# Patient Record
Sex: Male | Born: 1965 | Hispanic: No | Marital: Married | Smoking: Former smoker
Health system: Southern US, Community
[De-identification: ages and names within clinical notes are randomized; demographics above are authoritative.]

## PROBLEM LIST (undated history)

## (undated) DIAGNOSIS — J45909 Unspecified asthma, uncomplicated: Secondary | ICD-10-CM

## (undated) DIAGNOSIS — D649 Anemia, unspecified: Secondary | ICD-10-CM

## (undated) DIAGNOSIS — Z9109 Other allergy status, other than to drugs and biological substances: Secondary | ICD-10-CM

## (undated) DIAGNOSIS — T7840XA Allergy, unspecified, initial encounter: Secondary | ICD-10-CM

## (undated) HISTORY — DX: Anemia, unspecified: D64.9

## (undated) HISTORY — PX: POLYPECTOMY: SHX149

## (undated) HISTORY — DX: Allergy, unspecified, initial encounter: T78.40XA

---

## 2006-06-11 ENCOUNTER — Ambulatory Visit (HOSPITAL_COMMUNITY): Admission: RE | Admit: 2006-06-11 | Discharge: 2006-06-11 | Payer: Self-pay | Admitting: Surgery

## 2008-12-18 HISTORY — PX: POLYPECTOMY: SHX149

## 2009-12-18 HISTORY — PX: INGUINAL HERNIA REPAIR: SUR1180

## 2012-08-06 ENCOUNTER — Emergency Department
Admission: EM | Admit: 2012-08-06 | Discharge: 2012-08-06 | Disposition: A | Discharge status: Home / Self Care | Payer: PRIVATE HEALTH INSURANCE | Source: Home / Self Care

## 2012-08-06 ENCOUNTER — Emergency Department (INDEPENDENT_AMBULATORY_CARE_PROVIDER_SITE_OTHER): Payer: PRIVATE HEALTH INSURANCE

## 2012-08-06 ENCOUNTER — Encounter: Payer: Self-pay | Admitting: Emergency Medicine

## 2012-08-06 DIAGNOSIS — J449 Chronic obstructive pulmonary disease, unspecified: Secondary | ICD-10-CM

## 2012-08-06 DIAGNOSIS — R062 Wheezing: Secondary | ICD-10-CM

## 2012-08-06 DIAGNOSIS — J069 Acute upper respiratory infection, unspecified: Secondary | ICD-10-CM

## 2012-08-06 DIAGNOSIS — R05 Cough: Secondary | ICD-10-CM

## 2012-08-06 HISTORY — DX: Other allergy status, other than to drugs and biological substances: Z91.09

## 2012-08-06 HISTORY — DX: Unspecified asthma, uncomplicated: J45.909

## 2012-08-06 MED ORDER — DOXYCYCLINE HYCLATE 100 MG PO CAPS: 100.0000 mg | ORAL_CAPSULE | Freq: Two times a day (BID) | ORAL | Status: AC

## 2012-08-06 MED ORDER — ALBUTEROL SULFATE HFA 108 (90 BASE) MCG/ACT IN AERS: 2.0000 | INHALATION_SPRAY | RESPIRATORY_TRACT | Status: AC | PRN

## 2012-08-06 MED ORDER — METHYLPREDNISOLONE SODIUM SUCC 125 MG IJ SOLR
125.0000 mg | Freq: Once | INTRAMUSCULAR | Status: AC
  Administered 2012-08-06: 125 mg via INTRAMUSCULAR

## 2012-08-06 MED ORDER — PREDNISONE 50 MG PO TABS: ORAL_TABLET | ORAL | Status: AC

## 2012-08-06 NOTE — ED Notes (Signed)
Patient reports he has had URI/sinus problems for weeks; has had 2 courses of prednisone and symptoms return: nasal congestion, cough, sinus pain, SOB, wheezing (hx chronic asthma) and fatigue.

## 2012-08-06 NOTE — ED Provider Notes (Signed)
History     CSN: [id number]  Arrival date & time 08/06/12  1601   First MD Initiated Contact with Patient 08/06/12 1603      Chief Complaint  Patient presents with  . Nasal Congestion  . Cough  . Facial Pain   HPI URI Symptoms Onset: 2-3 days   Description: rhinorrhea, nasal congestion, wheezing, SOB.   Modifying factors:  Pt has been seen by allergist as well as PCP for similar sxs twice in the past 2 months. Has been on 2 courses of prednisone. Pt states that sxs resolve with prednisone use, but then recur within 1-2 weeks of stopping steroid. Pt is also currently on advair 230-21 for asthma. Has been compliant with this. Does not have an albuterol inhaler for use. Predominant issue has been congestion and dyspnea/wheezing on exertion. Has had some new sick contacts at work over last 1-2 weeks. Pt also reports hx/o 20-25 pack year smoking. Currently not smoking.   Symptoms Nasal discharge: yes Fever: no Sore throat: no Cough: yes Wheezing: yes Ear pain: no GI symptoms: no Sick contacts: yes  Red Flags  Stiff neck: no Dyspnea: yes; exertional with wheezing Rash: no Swallowing difficulty: no  Sinusitis Risk Factors Headache/face pain: n Double sickening: no tooth pain: no  Allergy Risk Factors Sneezing: no Itchy scratchy throat: no Seasonal symptoms: yes  Flu Risk Factors Headache: no muscle aches: no severe fatigue: no    Past Medical History  Diagnosis Date  . Asthma   . Environmental allergies     Past Surgical History  Procedure Date  . Polypectomy     Family History  Problem Relation Age of Onset  . Hypertension Mother     History  Substance Use Topics  . Smoking status: Never Smoker   . Smokeless tobacco: Not on file  . Alcohol Use: No      Review of Systems  All other systems reviewed and are negative.    Allergies  Erythromycin  Home Medications   Current Outpatient Rx  Name Route Sig Dispense Refill  . CETIRIZINE HCL  10 MG PO CHEW Oral Chew 10 mg by mouth daily.    Marland Kitchen FLUTICASONE-SALMETEROL 230-21 MCG/ACT IN AERO Inhalation Inhale 2 puffs into the lungs 2 (two) times daily.      BP 106/66  Pulse 72  Temp 97.5 F (36.4 C) (Oral)  Resp 16  Ht 5\' 10"  (1.778 m)  Wt 150 lb (68.04 kg)  BMI 21.52 kg/m2  SpO2 95%  Physical Exam  Constitutional: He appears well-developed and well-nourished. No distress.  HENT:  Head: Normocephalic and atraumatic.  Right Ear: External ear normal.  Left Ear: External ear normal.       +nasal erythema, rhinorrhea bilaterally, + post oropharyngeal erythema    Eyes: Conjunctivae are normal. Pupils are equal, round, and reactive to light.  Neck: Normal range of motion.  Cardiovascular: Normal rate and regular rhythm.   Pulmonary/Chest: No respiratory distress. He has wheezes. He has no rales. He exhibits no tenderness.  Abdominal: Soft.  Musculoskeletal: Normal range of motion.  Lymphadenopathy:    He has no cervical adenopathy.  Neurological: He is alert.  Skin: Skin is warm.    ED Course  Procedures (including critical care time)  Labs Reviewed - No data to display Dg Chest 2 View  08/06/2012  *RADIOLOGY REPORT*  Clinical Data: Chronic cough, wheezing.  CHEST - 2 VIEW  Comparison: None.  Findings: There is hyperinflation of the lungs compatible with  COPD.  Peribronchial thickening.  Mild biapical scarring.  No acute opacities or effusions.  No acute bony abnormality.  IMPRESSION: COPD/chronic changes.  No active disease.   Original Report Authenticated By: Cyndie Chime, M.D.      1. URI (upper respiratory infection)   2. Obstructive lung disease       MDM  Suspect underlying COPD with viral induced COPD exacerbation.  Solumedrol 125mg  IM x1. Duoneb x 1.  Repeat lung exam shows improved aeration s/p duoneb.  Prednisone and doxy for treatment.  Plan for follow up with PCP in 3-5 days.  Suspect that pt may benefit from uptitration of resp medication  regimen vs. Formal PFTs if these have not already been done.  Resp and infectious red flags reviewed.      The patient and/or caregiver has been counseled thoroughly with regard to treatment plan and/or medications prescribed including dosage, schedule, interactions, rationale for use, and possible side effects and they verbalize understanding. Diagnoses and expected course of recovery discussed and will return if not improved as expected or if the condition worsens. Patient and/or caregiver verbalized understanding.               Doree Albee, MD 08/06/12 1757

## 2013-07-19 IMAGING — CR DG CHEST 2V
2 series · 2 of 2 positions shown · non-contrast
Comparison: None.

CLINICAL DATA: Chronic cough, wheezing.

CHEST - 2 VIEW

[view not recorded (1 of 2)]
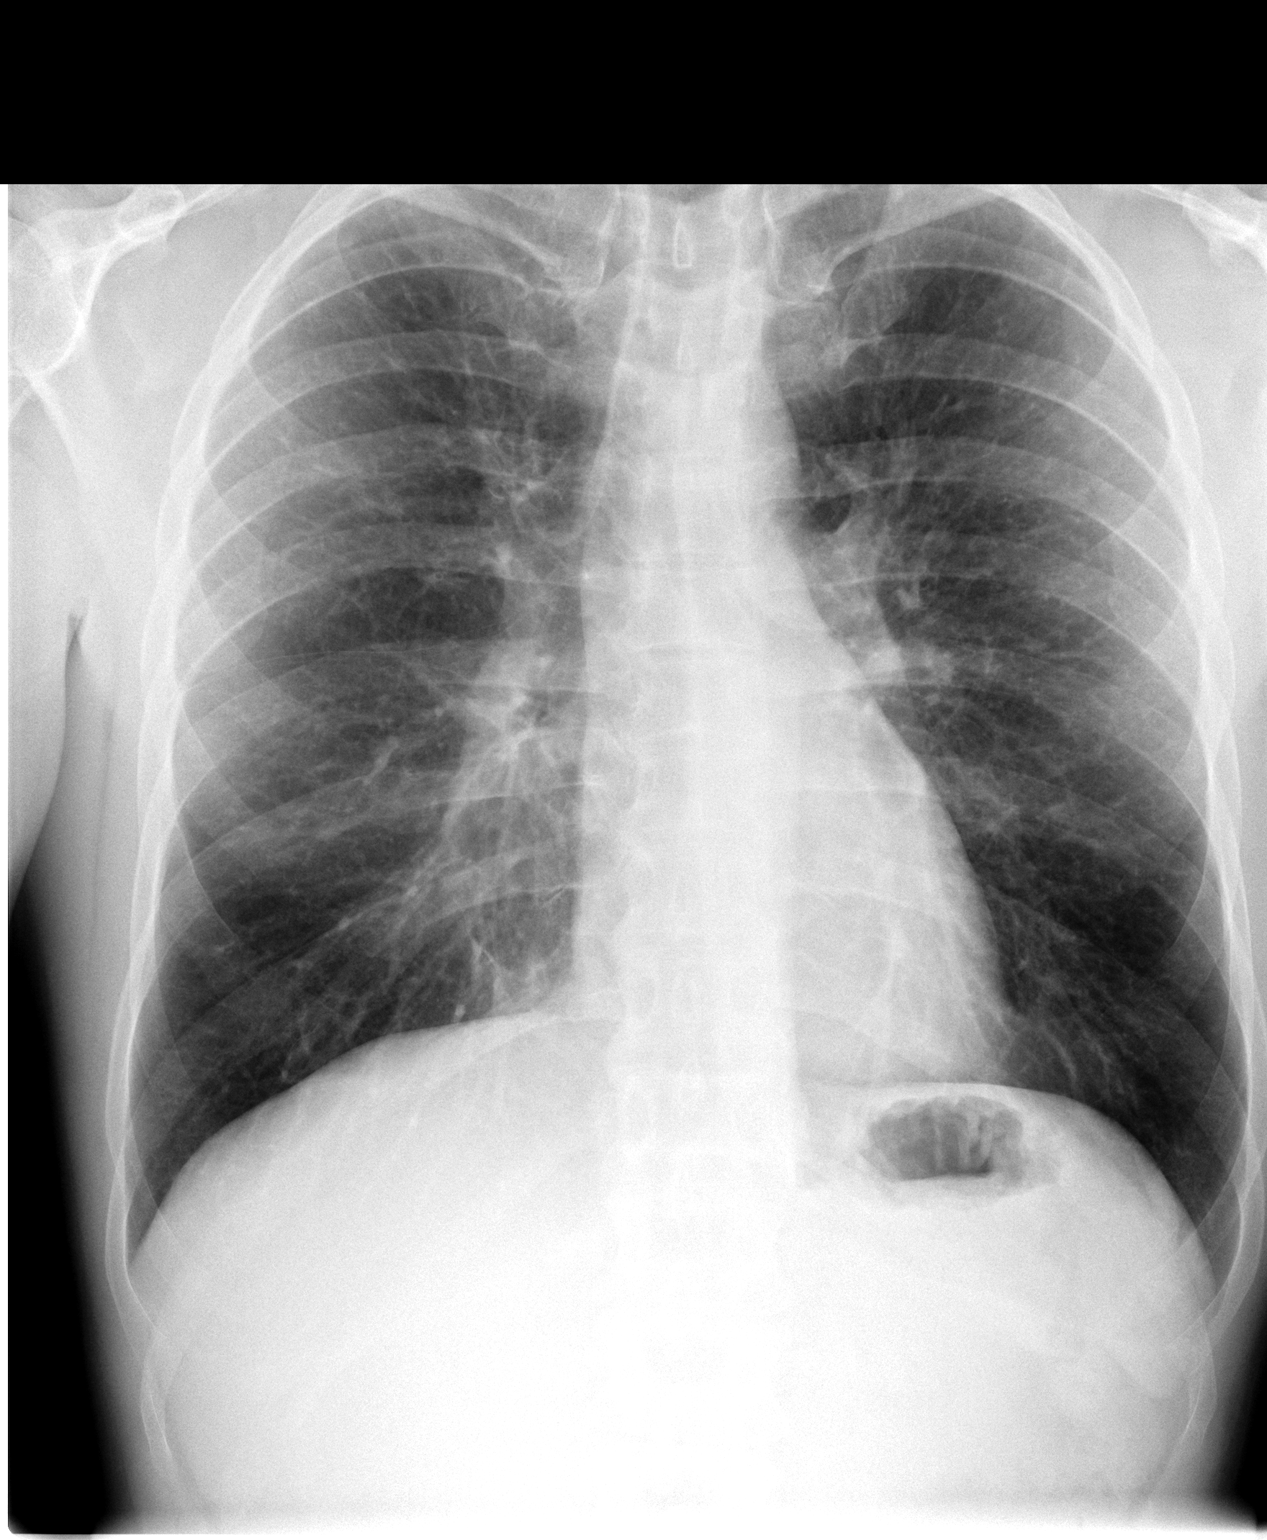

[view not recorded (2 of 2)]
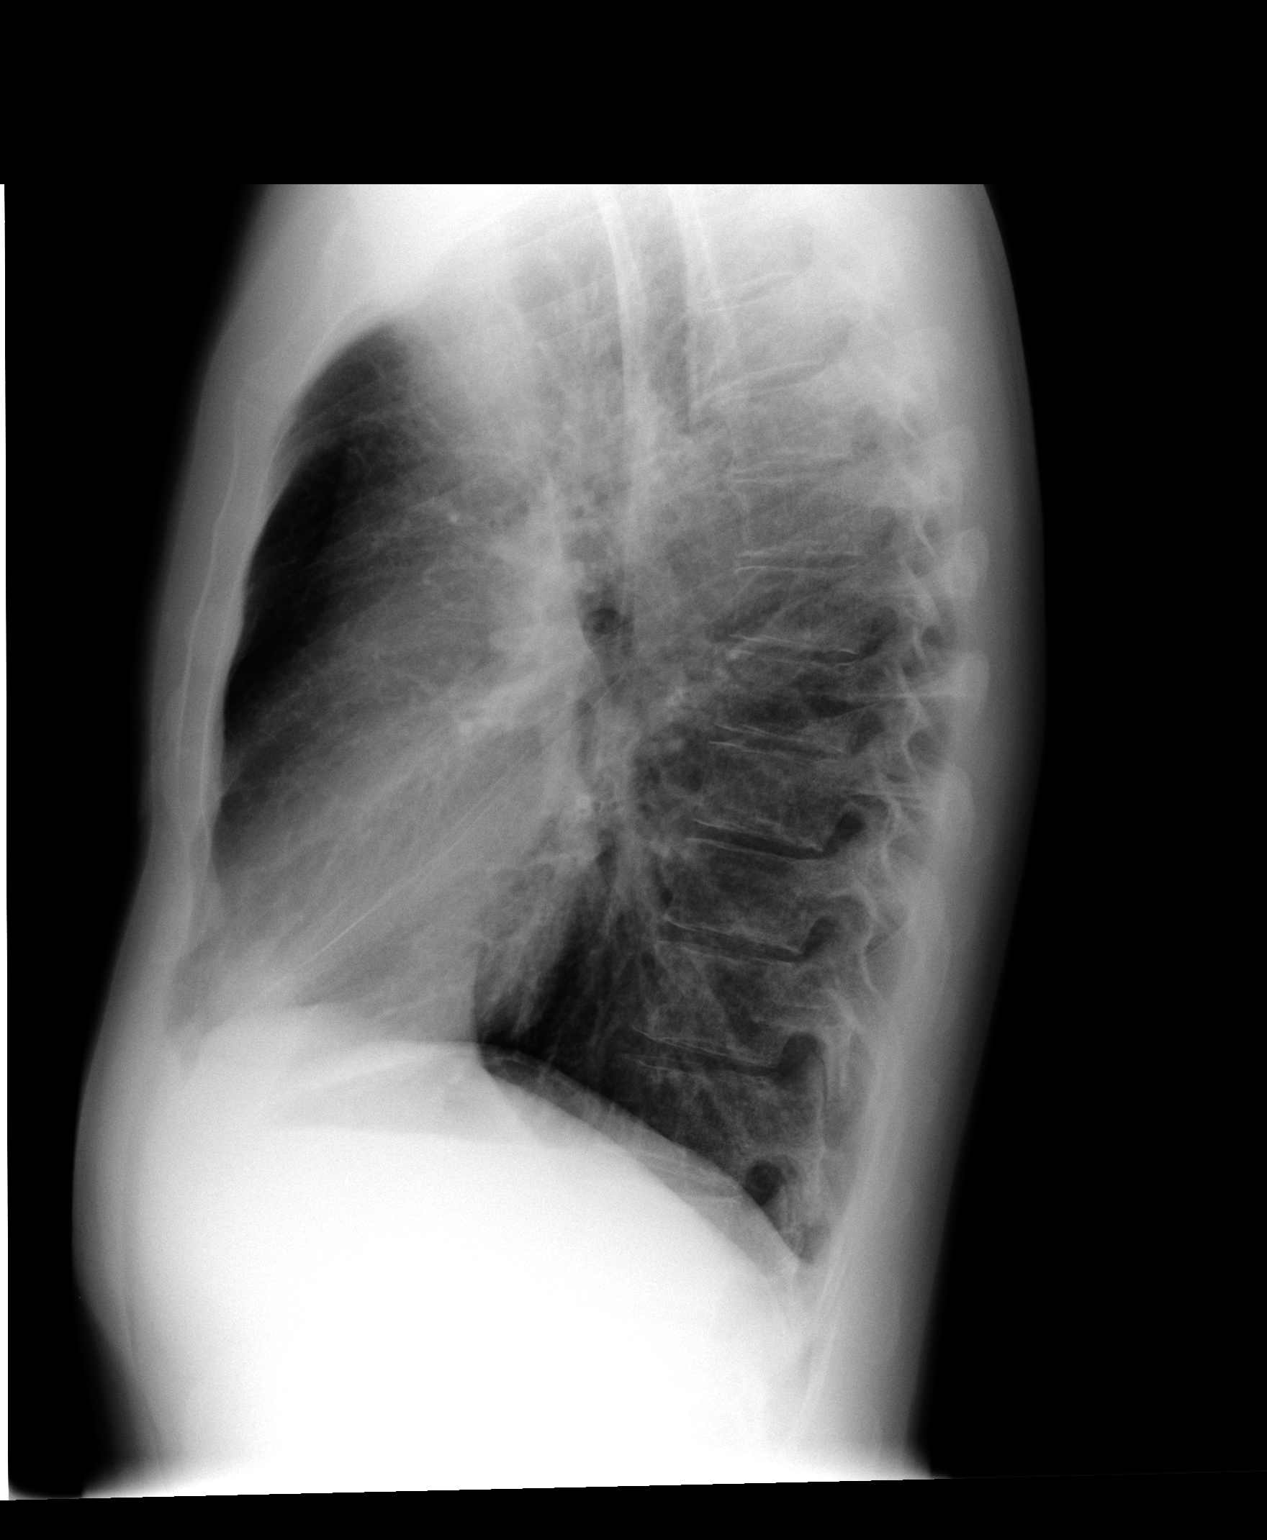

[2 of 2 positions shown; findings below may reference images not displayed]

FINDINGS: There is hyperinflation of the lungs compatible with
COPD.  Peribronchial thickening.  Mild biapical scarring.  No acute
opacities or effusions.  No acute bony abnormality.
IMPRESSION: COPD/chronic changes.  No active disease.

## 2017-04-09 DIAGNOSIS — M65341 Trigger finger, right ring finger: Secondary | ICD-10-CM | POA: Insufficient documentation

## 2017-07-17 DIAGNOSIS — J310 Chronic rhinitis: Secondary | ICD-10-CM | POA: Insufficient documentation

## 2017-07-17 DIAGNOSIS — J329 Chronic sinusitis, unspecified: Secondary | ICD-10-CM | POA: Insufficient documentation

## 2017-07-17 DIAGNOSIS — J339 Nasal polyp, unspecified: Secondary | ICD-10-CM | POA: Insufficient documentation

## 2017-07-17 DIAGNOSIS — R898 Other abnormal findings in specimens from other organs, systems and tissues: Secondary | ICD-10-CM | POA: Insufficient documentation

## 2020-05-21 DIAGNOSIS — J339 Nasal polyp, unspecified: Secondary | ICD-10-CM | POA: Diagnosis not present

## 2020-05-21 DIAGNOSIS — J4550 Severe persistent asthma, uncomplicated: Secondary | ICD-10-CM | POA: Diagnosis not present

## 2020-05-21 DIAGNOSIS — Z7189 Other specified counseling: Secondary | ICD-10-CM | POA: Diagnosis not present

## 2020-05-21 DIAGNOSIS — Z87891 Personal history of nicotine dependence: Secondary | ICD-10-CM | POA: Diagnosis not present

## 2020-05-22 DIAGNOSIS — U071 COVID-19: Secondary | ICD-10-CM | POA: Insufficient documentation

## 2022-04-06 DIAGNOSIS — J4550 Severe persistent asthma, uncomplicated: Secondary | ICD-10-CM | POA: Insufficient documentation

## 2022-04-06 DIAGNOSIS — J309 Allergic rhinitis, unspecified: Secondary | ICD-10-CM | POA: Insufficient documentation

## 2022-04-07 ENCOUNTER — Ambulatory Visit: Payer: BC Managed Care – PPO | Admitting: Physician Assistant

## 2022-04-07 ENCOUNTER — Encounter: Payer: Self-pay | Admitting: Physician Assistant

## 2022-04-07 VITALS — BP 123/79 | HR 70 | Ht 70.0 in | Wt 145.0 lb

## 2022-04-07 DIAGNOSIS — Z79899 Other long term (current) drug therapy: Secondary | ICD-10-CM

## 2022-04-07 DIAGNOSIS — Z125 Encounter for screening for malignant neoplasm of prostate: Secondary | ICD-10-CM

## 2022-04-07 DIAGNOSIS — Z1211 Encounter for screening for malignant neoplasm of colon: Secondary | ICD-10-CM | POA: Diagnosis not present

## 2022-04-07 DIAGNOSIS — Z13220 Encounter for screening for lipoid disorders: Secondary | ICD-10-CM | POA: Diagnosis not present

## 2022-04-07 DIAGNOSIS — Z131 Encounter for screening for diabetes mellitus: Secondary | ICD-10-CM

## 2022-04-07 DIAGNOSIS — Z1329 Encounter for screening for other suspected endocrine disorder: Secondary | ICD-10-CM

## 2022-04-07 DIAGNOSIS — J4550 Severe persistent asthma, uncomplicated: Secondary | ICD-10-CM

## 2022-04-07 NOTE — Patient Instructions (Signed)
Health Maintenance, Male Adopting a healthy lifestyle and getting preventive care are important in promoting health and wellness. Ask your health care provider about: The right schedule for you to have regular tests and exams. Things you can do on your own to prevent diseases and keep yourself healthy. What should I know about diet, weight, and exercise? Eat a healthy diet  Eat a diet that includes plenty of vegetables, fruits, low-fat dairy products, and lean protein. Do not eat a lot of foods that are high in solid fats, added sugars, or sodium. Maintain a healthy weight Body mass index (BMI) is a measurement that can be used to identify possible weight problems. It estimates body fat based on height and weight. Your health care provider can help determine your BMI and help you achieve or maintain a healthy weight. Get regular exercise Get regular exercise. This is one of the most important things you can do for your health. Most adults should: Exercise for at least 150 minutes each week. The exercise should increase your heart rate and make you sweat (moderate-intensity exercise). Do strengthening exercises at least twice a week. This is in addition to the moderate-intensity exercise. Spend less time sitting. Even light physical activity can be beneficial. Watch cholesterol and blood lipids Have your blood tested for lipids and cholesterol at 57 years of age, then have this test every 5 years. You may need to have your cholesterol levels checked more often if: Your lipid or cholesterol levels are high. You are older than 57 years of age. You are at high risk for heart disease. What should I know about cancer screening? Many types of cancers can be detected early and may often be prevented. Depending on your health history and family history, you may need to have cancer screening at various ages. This may include screening for: Colorectal cancer. Prostate cancer. Skin cancer. Lung  cancer. What should I know about heart disease, diabetes, and high blood pressure? Blood pressure and heart disease High blood pressure causes heart disease and increases the risk of stroke. This is more likely to develop in people who have high blood pressure readings or are overweight. Talk with your health care provider about your target blood pressure readings. Have your blood pressure checked: Every 3-5 years if you are 18-39 years of age. Every year if you are 40 years old or older. If you are between the ages of 65 and 75 and are a current or former smoker, ask your health care provider if you should have a one-time screening for abdominal aortic aneurysm (AAA). Diabetes Have regular diabetes screenings. This checks your fasting blood sugar level. Have the screening done: Once every three years after age 45 if you are at a normal weight and have a low risk for diabetes. More often and at a younger age if you are overweight or have a high risk for diabetes. What should I know about preventing infection? Hepatitis B If you have a higher risk for hepatitis B, you should be screened for this virus. Talk with your health care provider to find out if you are at risk for hepatitis B infection. Hepatitis C Blood testing is recommended for: Everyone born from 1945 through 1965. Anyone with known risk factors for hepatitis C. Sexually transmitted infections (STIs) You should be screened each year for STIs, including gonorrhea and chlamydia, if: You are sexually active and are younger than 57 years of age. You are older than 57 years of age and your   health care provider tells you that you are at risk for this type of infection. Your sexual activity has changed since you were last screened, and you are at increased risk for chlamydia or gonorrhea. Ask your health care provider if you are at risk. Ask your health care provider about whether you are at high risk for HIV. Your health care provider  may recommend a prescription medicine to help prevent HIV infection. If you choose to take medicine to prevent HIV, you should first get tested for HIV. You should then be tested every 3 months for as long as you are taking the medicine. Follow these instructions at home: Alcohol use Do not drink alcohol if your health care provider tells you not to drink. If you drink alcohol: Limit how much you have to 0-2 drinks a day. Know how much alcohol is in your drink. In the [place] one drink equals one 12 oz bottle of beer (355 mL), one 5 oz glass of wine (148 mL), or one 1 oz glass of hard liquor (44 mL). Lifestyle Do not use any products that contain nicotine or tobacco. These products include cigarettes, chewing tobacco, and vaping devices, such as e-cigarettes. If you need help quitting, ask your health care provider. Do not use street drugs. Do not share needles. Ask your health care provider for help if you need support or information about quitting drugs. General instructions Schedule regular health, dental, and eye exams. Stay current with your vaccines. Tell your health care provider if: You often feel depressed. You have ever been abused or do not feel safe at home. Summary Adopting a healthy lifestyle and getting preventive care are important in promoting health and wellness. Follow your health care provider's instructions about healthy diet, exercising, and getting tested or screened for diseases. Follow your health care provider's instructions on monitoring your cholesterol and blood pressure. This information is not intended to replace advice given to you by your health care provider. Make sure you discuss any questions you have with your health care provider. Document Revised: 04/25/2021 Document Reviewed: 04/25/2021 Elsevier Patient Education  2023 Elsevier Inc.  

## 2022-04-07 NOTE — Progress Notes (Signed)
? ?New Patient Office Visit ? ?Subjective   ? ?Patient ID: Terry Phillips, male    DOB: 1966-07-02  Age: 56 y.o. MRN: [id number] ? ?CC:  ?Chief Complaint  ?Patient presents with  ? Establish Care  ? ? ?HPI ?Terry Phillips presents to establish care.  ? ?No problems or concerns.  ? ?Outpatient Encounter Medications as of 04/07/2022  ?Medication Sig  ? Benralizumab (FASENRA PEN) 30 MG/ML SOAJ Inject into the skin.  ? budesonide-formoterol (SYMBICORT) 160-4.5 MCG/ACT inhaler Inhale 2 puffs into the lungs 2 (two) times daily.  ? cetirizine (ZYRTEC) 10 MG chewable tablet Chew 10 mg by mouth daily.  ? [DISCONTINUED] fluticasone-salmeterol (ADVAIR HFA) 230-21 MCG/ACT inhaler Inhale 2 puffs into the lungs 2 (two) times daily.  ? albuterol (PROVENTIL HFA;VENTOLIN HFA) 108 (90 BASE) MCG/ACT inhaler Inhale 2 puffs into the lungs every 4 (four) hours as needed for wheezing (cough, shortness of breath or wheezing.). (Patient not taking: Reported on 04/07/2022)  ? ?No facility-administered encounter medications on file as of 04/07/2022.  ? ? ?Past Medical History:  ?Diagnosis Date  ? Asthma   ? Environmental allergies   ? ? ?Past Surgical History:  ?Procedure Laterality Date  ? POLYPECTOMY    ? ? ?Family History  ?Problem Relation Age of Onset  ? Hypertension Mother   ? ? ?Social History  ? ?Socioeconomic History  ? Marital status: Married  ?  Spouse name: Not on file  ? Number of children: Not on file  ? Years of education: Not on file  ? Highest education level: Not on file  ?Occupational History  ? Not on file  ?Tobacco Use  ? Smoking status: Former  ?  Packs/day: 0.50  ?  Years: 20.00  ?  Pack years: 10.00  ?  Types: Cigarettes  ?  Start date: 01/20/1992  ?  Quit date: 01/20/2012  ?  Years since quitting: 10.2  ? Smokeless tobacco: Not on file  ?Substance and Sexual Activity  ? Alcohol use: No  ? Drug use: No  ? Sexual activity: Not on file  ?Other Topics Concern  ? Not on file  ?Social History Narrative  ? Not on file   ? ?Social Determinants of Health  ? ?Financial Resource Strain: Not on file  ?Food Insecurity: Not on file  ?Transportation Needs: Not on file  ?Physical Activity: Not on file  ?Stress: Not on file  ?Social Connections: Not on file  ?Intimate Partner Violence: Not on file  ? ? ?Review of Systems  ?All other systems reviewed and are negative. ? ?  ? ? ?Objective   ? ?BP 123/79   Pulse 70   Ht '5\' 10"'$  (1.778 m)   Wt 145 lb (65.8 kg)   SpO2 100%   BMI 20.81 kg/m?  ? ?Physical Exam ?Vitals reviewed.  ?Constitutional:   ?   Appearance: Normal appearance.  ?HENT:  ?   Head: Normocephalic.  ?   Right Ear: Tympanic membrane normal.  ?   Left Ear: Tympanic membrane normal.  ?   Nose: Nose normal.  ?Neck:  ?   Vascular: No carotid bruit.  ?Cardiovascular:  ?   Rate and Rhythm: Normal rate and regular rhythm.  ?   Pulses: Normal pulses.  ?   Heart sounds: Normal heart sounds.  ?Pulmonary:  ?   Effort: Pulmonary effort is normal.  ?   Breath sounds: Normal breath sounds.  ?Musculoskeletal:  ?   Right lower leg: No edema.  ?  Left lower leg: No edema.  ?Lymphadenopathy:  ?   Cervical: No cervical adenopathy.  ?Neurological:  ?   General: No focal deficit present.  ?   Mental Status: He is alert and oriented to person, place, and time.  ?Psychiatric:     ?   Mood and Affect: Mood normal.  ? ? ? .. ? ?  04/07/2022  ?  8:48 AM  ?Depression screen PHQ 2/9  ?Decreased Interest 0  ?Down, Depressed, Hopeless 0  ?PHQ - 2 Score 0  ?Altered sleeping 1  ?Tired, decreased energy 0  ?Change in appetite 0  ?Feeling bad or failure about yourself  0  ?Trouble concentrating 0  ?Moving slowly or fidgety/restless 0  ?Suicidal thoughts 0  ?PHQ-9 Score 1  ?Difficult doing work/chores Not difficult at all  ? ?.. ? ?  04/07/2022  ?  8:48 AM  ?GAD 7 : Generalized Anxiety Score  ?Nervous, Anxious, on Edge 0  ?Control/stop worrying 0  ?Worry too much - different things 0  ?Trouble relaxing 0  ?Restless 0  ?Easily annoyed or irritable 0  ?Afraid -  awful might happen 0  ?Total GAD 7 Score 0  ?Anxiety Difficulty Not difficult at all  ? ? ? ? ?Assessment & Plan:  ?..Terry Phillips was seen today for establish care. ? ?Diagnoses and all orders for this visit: ? ?Severe persistent asthma, unspecified whether complicated ? ?Colon cancer screening ?-     Ambulatory referral to Gastroenterology ? ?Screening PSA (prostate specific antigen) ?-     PSA ? ?Lipid screening ?-     Lipid Panel w/reflex Direct LDL ? ?Thyroid disorder screen ?-     TSH ? ?Diabetes mellitus screening ?-     COMPLETE METABOLIC PANEL WITH GFR ? ?Medication management ?-     PSA ?-     TSH ?-     Lipid Panel w/reflex Direct LDL ?-     COMPLETE METABOLIC PANEL WITH GFR ?-     CBC with Differential/Platelet ? ? ? ?Labs ordered.  ?PHQ/GAD no concerns.  ?Fasting labs ordered.  ?Declined shingles vaccine ?Colonoscopy referral made ? ?Return in about 1 year (around 04/08/2023).  ? ?Iran Planas, PA-C ? ? ?

## 2022-04-18 DIAGNOSIS — Z125 Encounter for screening for malignant neoplasm of prostate: Secondary | ICD-10-CM | POA: Diagnosis not present

## 2022-04-18 DIAGNOSIS — Z131 Encounter for screening for diabetes mellitus: Secondary | ICD-10-CM | POA: Diagnosis not present

## 2022-04-18 DIAGNOSIS — Z13220 Encounter for screening for lipoid disorders: Secondary | ICD-10-CM | POA: Diagnosis not present

## 2022-04-18 DIAGNOSIS — Z79899 Other long term (current) drug therapy: Secondary | ICD-10-CM | POA: Diagnosis not present

## 2022-04-19 ENCOUNTER — Encounter: Payer: Self-pay | Admitting: Physician Assistant

## 2022-04-19 DIAGNOSIS — D649 Anemia, unspecified: Secondary | ICD-10-CM | POA: Insufficient documentation

## 2022-04-19 NOTE — Progress Notes (Signed)
Prostate looks good.  ?Thyroid normal range.  ?Kidney, liver, glucose look good.  ?Cholesterol looks good for age and risk factors.  ?You are anemic. JJ please add ferritin and serum iron to labs.  ?You are not noticing any blood in stools or abdominal pain?  ?Do you eat meat?  ?We do need to get that colonoscopy. Please schedule this.  ?I would add iron supplement daily. Recheck in 4 weeks.  ? ? ?

## 2022-04-21 ENCOUNTER — Other Ambulatory Visit: Payer: Self-pay | Admitting: Neurology

## 2022-04-21 DIAGNOSIS — D509 Iron deficiency anemia, unspecified: Secondary | ICD-10-CM

## 2022-04-21 LAB — TEST AUTHORIZATION

## 2022-04-21 LAB — COMPLETE METABOLIC PANEL WITH GFR
AG Ratio: 1.5 (calc) (ref 1.0–2.5)
ALT: 11 U/L (ref 9–46)
AST: 15 U/L (ref 10–35)
Albumin: 4.3 g/dL (ref 3.6–5.1)
Alkaline phosphatase (APISO): 43 U/L (ref 35–144)
BUN: 18 mg/dL (ref 7–25)
CO2: 30 mmol/L (ref 20–32)
Calcium: 9.8 mg/dL (ref 8.6–10.3)
Chloride: 104 mmol/L (ref 98–110)
Creat: 1.23 mg/dL (ref 0.70–1.30)
Globulin: 2.9 g/dL (calc) (ref 1.9–3.7)
Glucose, Bld: 79 mg/dL (ref 65–99)
Potassium: 4.4 mmol/L (ref 3.5–5.3)
Sodium: 141 mmol/L (ref 135–146)
Total Bilirubin: 0.6 mg/dL (ref 0.2–1.2)
Total Protein: 7.2 g/dL (ref 6.1–8.1)
eGFR: 69 mL/min/{1.73_m2} (ref >=60)

## 2022-04-21 LAB — CBC WITH DIFFERENTIAL/PLATELET
Absolute Monocytes: 592 cells/uL (ref 200–950)
Basophils Absolute: 17 cells/uL (ref 0–200)
Basophils Relative: 0.3 %
Eosinophils Absolute: 110 cells/uL (ref 15–500)
Eosinophils Relative: 1.9 %
HCT: 35.5 % — ABNORMAL LOW (ref 38.5–50.0)
Hemoglobin: 11.7 g/dL — ABNORMAL LOW (ref 13.2–17.1)
Lymphs Abs: 1351 cells/uL (ref 850–3900)
MCH: 29.8 pg (ref 27.0–33.0)
MCHC: 33 g/dL (ref 32.0–36.0)
MCV: 90.3 fL (ref 80.0–100.0)
MPV: 10.1 fL (ref 7.5–12.5)
Monocytes Relative: 10.2 %
Neutro Abs: 3729 cells/uL (ref 1500–7800)
Neutrophils Relative %: 64.3 %
Platelets: 370 10*3/uL (ref 140–400)
RBC: 3.93 10*6/uL — ABNORMAL LOW (ref 4.20–5.80)
RDW: 12.3 % (ref 11.0–15.0)
Total Lymphocyte: 23.3 %
WBC: 5.8 10*3/uL (ref 3.8–10.8)

## 2022-04-21 LAB — LIPID PANEL W/REFLEX DIRECT LDL
Cholesterol: 186 mg/dL (ref <200)
HDL: 63 mg/dL (ref >=40)
LDL Cholesterol (Calc): 106 mg/dL (calc) — ABNORMAL HIGH
Non-HDL Cholesterol (Calc): 123 mg/dL (calc) (ref <130)
Total CHOL/HDL Ratio: 3 (calc) (ref <5.0)
Triglycerides: 77 mg/dL (ref <150)

## 2022-04-21 LAB — IRON,TIBC AND FERRITIN PANEL
%SAT: 25 % (calc) (ref 20–48)
Ferritin: 60 ng/mL (ref 38–380)
Iron: 91 ug/dL (ref 50–180)
TIBC: 371 mcg/dL (calc) (ref 250–425)

## 2022-04-21 LAB — TSH: TSH: 3.24 mIU/L (ref 0.40–4.50)

## 2022-04-21 LAB — PSA: PSA: 0.47 ng/mL (ref <=4.00)

## 2022-04-21 NOTE — Progress Notes (Signed)
Serum iron and iron stores low normal. Add iron and recheck in 4 weeks. Please schedule colonoscopy.

## 2022-04-24 ENCOUNTER — Telehealth: Payer: Self-pay

## 2022-04-24 NOTE — Telephone Encounter (Signed)
Last week pt had a sinus infection no fever , and also poison  ivy  went to urgent care in [place], got round of prednisone. Pt would like antibiotics for sinus. Pt has allergies to  Erythromycin and amoxicillin, pt also has a tick bite, not allergic penicillin. ? ?I did advise the pt an appointment may be necessary to address all issues,at a possibility  that we unable to prescribe an antibiotic without being seen. ?

## 2022-04-25 NOTE — Telephone Encounter (Signed)
Called and offered patient 1:30pm check in/1:40pm appt with [facility] for tomorrow, he stated he would call us back when he checks his calender. AMUCK ?

## 2022-04-25 NOTE — Telephone Encounter (Signed)
Per Luvenia Starch needs a visit to discuss. Please call patient to schedule.  ?

## 2022-04-27 DIAGNOSIS — Z713 Dietary counseling and surveillance: Secondary | ICD-10-CM | POA: Diagnosis not present

## 2022-05-12 ENCOUNTER — Encounter: Payer: Self-pay | Admitting: Gastroenterology

## 2022-05-23 ENCOUNTER — Encounter: Payer: Self-pay | Admitting: Gastroenterology

## 2022-05-23 ENCOUNTER — Ambulatory Visit (AMBULATORY_SURGERY_CENTER): Payer: Self-pay

## 2022-05-23 VITALS — Ht 69.0 in | Wt 144.6 lb

## 2022-05-23 DIAGNOSIS — Z1211 Encounter for screening for malignant neoplasm of colon: Secondary | ICD-10-CM

## 2022-05-23 MED ORDER — NA SULFATE-K SULFATE-MG SULF 17.5-3.13-1.6 GM/177ML PO SOLN: 1.0000 | Freq: Once | ORAL | 0 refills | Status: AC

## 2022-05-23 NOTE — Progress Notes (Signed)
No egg or soy allergy known to patient  No issues known to pt with past sedation with any surgeries or procedures Patient denies ever being told they had issues or difficulty with intubation  No FH of Malignant Hyperthermia Pt is not on diet pills Pt is not on  home 02  Pt is not on blood thinners  Pt denies issues with constipation  No A fib or A flutter    NO PA's for preps discussed with pt In PV today  Discussed with pt there will be an out-of-pocket cost for prep and that varies from $0 to 70 +  dollars - pt verbalized understanding  Pt instructed to use Singlecare.com or GoodRx for a price reduction on prep   PV completed in person.   Pt verified name, DOB, address and insurance during PV today.  Pt mailed instruction packet with copy of consent form to read and not return, and instructions.   Pt encouraged to call with questions or issues.   Insurance confirmed with pt at [facility] today

## 2022-05-30 DIAGNOSIS — Z713 Dietary counseling and surveillance: Secondary | ICD-10-CM | POA: Diagnosis not present

## 2022-05-31 ENCOUNTER — Encounter: Payer: Self-pay | Admitting: Gastroenterology

## 2022-05-31 ENCOUNTER — Ambulatory Visit (AMBULATORY_SURGERY_CENTER): Payer: BC Managed Care – PPO | Admitting: Gastroenterology

## 2022-05-31 VITALS — BP 105/54 | HR 67 | Temp 97.3°F | Resp 16 | Ht 69.0 in | Wt 144.0 lb

## 2022-05-31 DIAGNOSIS — K635 Polyp of colon: Secondary | ICD-10-CM

## 2022-05-31 DIAGNOSIS — Z1211 Encounter for screening for malignant neoplasm of colon: Secondary | ICD-10-CM

## 2022-05-31 DIAGNOSIS — D122 Benign neoplasm of ascending colon: Secondary | ICD-10-CM

## 2022-05-31 DIAGNOSIS — D120 Benign neoplasm of cecum: Secondary | ICD-10-CM

## 2022-05-31 MED ORDER — SODIUM CHLORIDE 0.9 % IV SOLN: 500.0000 mL | Freq: Once | INTRAVENOUS | Status: DC

## 2022-05-31 NOTE — Progress Notes (Signed)
Pt's states no medical or surgical changes since previsit or office visit. 

## 2022-05-31 NOTE — Patient Instructions (Signed)
YOU HAD AN ENDOSCOPIC PROCEDURE TODAY AT [facility]:   Refer to the procedure report that was given to you for any specific questions about what was found during the examination.  If the procedure report does not answer your questions, please call your gastroenterologist to clarify.  If you requested that your care partner not be given the details of your procedure findings, then the procedure report has been included in a sealed envelope for you to review at your convenience later.  **Handouts given on polyps and hemorrhoids**  YOU SHOULD EXPECT: Some feelings of bloating in the abdomen. Passage of more gas than usual.  Walking can help get rid of the air that was put into your GI tract during the procedure and reduce the bloating. If you had a lower endoscopy (such as a colonoscopy or flexible sigmoidoscopy) you may notice spotting of blood in your stool or on the toilet paper. If you underwent a bowel prep for your procedure, you may not have a normal bowel movement for a few days.  Please Note:  You might notice some irritation and congestion in your nose or some drainage.  This is from the oxygen used during your procedure.  There is no need for concern and it should clear up in a day or so.  SYMPTOMS TO REPORT IMMEDIATELY:  Following lower endoscopy (colonoscopy or flexible sigmoidoscopy):  Excessive amounts of blood in the stool  Significant tenderness or worsening of abdominal pains  Swelling of the abdomen that is new, acute  Fever of 100F or higher   For urgent or emergent issues, a gastroenterologist can be reached at any hour by calling [phone]. Do not use MyChart messaging for urgent concerns.    DIET:  We do recommend a small meal at first, but then you may proceed to your regular diet.  Drink plenty of fluids but you should avoid alcoholic beverages for 24 hours.  ACTIVITY:  You should plan to take it easy for the rest of today and you should NOT DRIVE  or use heavy machinery until tomorrow (because of the sedation medicines used during the test).    FOLLOW UP: Our staff will call the number listed on your records 24-72 hours following your procedure to check on you and address any questions or concerns that you may have regarding the information given to you following your procedure. If we do not reach you, we will leave a message.  We will attempt to reach you two times.  During this call, we will ask if you have developed any symptoms of COVID 19. If you develop any symptoms (ie: fever, flu-like symptoms, shortness of breath, cough etc.) before then, please call [phone].  If you test positive for Covid 19 in the 2 weeks post procedure, please call and report this information to [place].    If any biopsies were taken you will be contacted by phone or by letter within the next 1-3 weeks.  Please call us at [phone] if you have not heard about the biopsies in 3 weeks.    SIGNATURES/CONFIDENTIALITY: You and/or your care partner have signed paperwork which will be entered into your electronic medical record.  These signatures attest to the fact that that the information above on your After Visit Summary has been reviewed and is understood.  Full responsibility of the confidentiality of this discharge information lies with you and/or your care-partner.

## 2022-05-31 NOTE — Op Note (Signed)
[facility] Patient Name: Terry Phillips Procedure Date: 05/31/2022 9:06 AM MRN: [id number] Endoscopist: Jackquline Denmark , MD Age: 56 Referring MD:  Date of Birth: 11/09/1966 Gender: Male Account #: [id number] Procedure:                Colonoscopy Indications:              Screening for colorectal malignant neoplasm Medicines:                Monitored Anesthesia Care Procedure:                Pre-Anesthesia Assessment:                           - Prior to the procedure, a History and Physical                            was performed, and patient medications and                            allergies were reviewed. The patient's tolerance of                            previous anesthesia was also reviewed. The risks                            and benefits of the procedure and the sedation                            options and risks were discussed with the patient.                            All questions were answered, and informed consent                            was obtained. Prior Anticoagulants: The patient has                            taken no previous anticoagulant or antiplatelet                            agents. ASA Grade Assessment: II - A patient with                            mild systemic disease. After reviewing the risks                            and benefits, the patient was deemed in                            satisfactory condition to undergo the procedure.                           After obtaining informed consent, the colonoscope  was passed under direct vision. Throughout the                            procedure, the patient's blood pressure, pulse, and                            oxygen saturations were monitored continuously. The                            Olympus PCF-H190DL ([id number]) Colonoscope was                            introduced through the anus and advanced to the 2                            cm into the ileum.  The colonoscopy was performed                            without difficulty. The patient tolerated the                            procedure well. The quality of the bowel                            preparation was good. The terminal ileum, ileocecal                            valve, appendiceal orifice, and rectum were                            photographed. Scope In: 9:18:01 AM Scope Out: 9:47:01 AM Scope Withdrawal Time: 0 hours 22 minutes 45 seconds  Total Procedure Duration: 0 hours 29 minutes 0 seconds  Findings:                 Two sessile polyps were found in the ascending                            colon and cecum. The polyps were 2 to 4 mm in size.                            These polyps were removed with a cold snare.                            Resection and retrieval were complete.                           Non-bleeding internal hemorrhoids were found during                            retroflexion. The hemorrhoids were very small and                            Grade I (internal hemorrhoids that do not prolapse).  The exam was otherwise without abnormality on                            direct and retroflexion views. Complications:            No immediate complications. Estimated Blood Loss:     Estimated blood loss: none. Impression:               - Two 2 to 4 mm polyps in the ascending colon and                            in the cecum, removed with a cold snare. Resected                            and retrieved.                           - Non-bleeding internal hemorrhoids.                           - The examination was otherwise normal on direct                            and retroflexion views. Recommendation:           - Patient has a contact number available for                            emergencies. The signs and symptoms of potential                            delayed complications were discussed with the                            patient.  Return to normal activities tomorrow.                            Written discharge instructions were provided to the                            patient.                           - Resume previous diet.                           - Continue present medications.                           - Await pathology results.                           - Repeat colonoscopy for surveillance based on                            pathology results.                           -  The findings and recommendations were discussed                            with the patient's family. Jackquline Denmark, MD 05/31/2022 9:51:37 AM This report has been signed electronically.

## 2022-05-31 NOTE — Progress Notes (Signed)
Vss nad trans to pacu °

## 2022-05-31 NOTE — Progress Notes (Signed)
[facility] History and Physical   Primary Care Physician:  Lavada Mesi   Reason for Procedure:   Colorectal cancer screening  Plan:     colonoscopy     HPI: Terry Phillips is a 56 y.o. male    Past Medical History:  Diagnosis Date   Allergy    seasonal   Anemia    Asthma    Environmental allergies     Past Surgical History:  Procedure Laterality Date   INGUINAL HERNIA REPAIR Right 2011   with mesh   POLYPECTOMY  2010   nasal    Prior to Admission medications   Medication Sig Start Date End Date Taking? Authorizing Provider  cetirizine (ZYRTEC) 10 MG chewable tablet Chew 10 mg by mouth daily.   Yes [provider]  albuterol (PROVENTIL HFA;VENTOLIN HFA) 108 (90 BASE) MCG/ACT inhaler Inhale 2 puffs into the lungs every 4 (four) hours as needed for wheezing (cough, shortness of breath or wheezing.). 08/06/12 04/07/22  Deneise Lever, MD  azithromycin (ZITHROMAX) 250 MG tablet Take 250 mg by mouth daily. 05/26/22   [provider]  Benralizumab (FASENRA PEN) 30 MG/ML SOAJ Inject into the skin. 02/27/22   [provider]  budesonide-formoterol (SYMBICORT) 160-4.5 MCG/ACT inhaler Inhale 2 puffs into the lungs 2 (two) times daily. 01/25/22   [provider]  Ferrous Gluconate (IRON 27 PO) Take by mouth.    [provider]    Current Outpatient Medications  Medication Sig Dispense Refill   cetirizine (ZYRTEC) 10 MG chewable tablet Chew 10 mg by mouth daily.     albuterol (PROVENTIL HFA;VENTOLIN HFA) 108 (90 BASE) MCG/ACT inhaler Inhale 2 puffs into the lungs every 4 (four) hours as needed for wheezing (cough, shortness of breath or wheezing.). 1 Inhaler 1   azithromycin (ZITHROMAX) 250 MG tablet Take 250 mg by mouth daily.     Benralizumab (FASENRA PEN) 30 MG/ML SOAJ Inject into the skin.     budesonide-formoterol (SYMBICORT) 160-4.5 MCG/ACT inhaler Inhale 2 puffs into the lungs 2 (two) times daily.      Ferrous Gluconate (IRON 27 PO) Take by mouth.     Current Facility-Administered Medications  Medication Dose Route Frequency Provider Last Rate Last Admin   0.9 %  sodium chloride infusion  500 mL Intravenous Once Jackquline Denmark, MD        Allergies as of 05/31/2022 - Review Complete 05/31/2022  Allergen Reaction Noted   Amoxicillin Nausea And Vomiting 06/07/2015   Erythromycin  08/06/2012   Erythromycin base Hives 06/09/2013    Family History  Problem Relation Age of Onset   Hypertension Mother    Colon cancer Neg Hx    Colon polyps Neg Hx    Esophageal cancer Neg Hx    Rectal cancer Neg Hx    Stomach cancer Neg Hx     Social History   Socioeconomic History   Marital status: Married    Spouse name: Not on file   Number of children: Not on file   Years of education: Not on file   Highest education level: Not on file  Occupational History   Not on file  Tobacco Use   Smoking status: Former    Packs/day: 0.50    Years: 20.00    Total pack years: 10.00    Types: Cigarettes    Start date: 01/20/1992    Quit date: 01/20/2012    Years since quitting: 10.3   Smokeless tobacco: Never  Vaping  Use   Vaping Use: Never used  Substance and Sexual Activity   Alcohol use: Yes    Comment: few per month, wine or beer   Drug use: Never   Sexual activity: Not on file  Other Topics Concern   Not on file  Social History Narrative   Not on file   Social Determinants of Health   Financial Resource Strain: Not on file  Food Insecurity: Not on file  Transportation Needs: Not on file  Physical Activity: Not on file  Stress: Not on file  Social Connections: Not on file  Intimate Partner Violence: Not on file    Review of Systems: Positive for none All other review of systems negative except as mentioned in the HPI.  Physical Exam: Vital signs in last 24 hours: '@VSRANGES'$ @   General:   Alert,  Well-developed, well-nourished, pleasant and cooperative in NAD Lungs:  Clear  throughout to auscultation.   Heart:  Regular rate and rhythm; no murmurs, clicks, rubs,  or gallops. Abdomen:  Soft, nontender and nondistended. Normal bowel sounds.   Neuro/Psych:  Alert and cooperative. Normal mood and affect. A and O x 3    No significant changes were identified.  The patient continues to be an appropriate candidate for the planned procedure and anesthesia.   Carmell Austria, MD. [facility] 05/31/2022 9:05 AM@

## 2022-06-01 ENCOUNTER — Telehealth: Payer: Self-pay

## 2022-06-01 NOTE — Telephone Encounter (Signed)
  Follow up Call-     05/31/2022    8:12 AM  Call back number  Post procedure Call Back phone  # [phone]  Permission to leave phone message Yes     Patient questions:  Do you have a fever, pain , or abdominal swelling? No. Pain Score  0 *  Have you tolerated food without any problems? Yes.    Have you been able to return to your normal activities? Yes.    Do you have any questions about your discharge instructions: Diet   No. Medications  No. Follow up visit  No.  Do you have questions or concerns about your Care? No.  Actions: * If pain score is 4 or above: No action needed, pain <4.

## 2022-06-04 ENCOUNTER — Encounter: Payer: Self-pay | Admitting: Gastroenterology

## 2022-06-06 DIAGNOSIS — Z87891 Personal history of nicotine dependence: Secondary | ICD-10-CM | POA: Diagnosis not present

## 2022-06-06 DIAGNOSIS — J339 Nasal polyp, unspecified: Secondary | ICD-10-CM | POA: Diagnosis not present

## 2022-06-06 DIAGNOSIS — J310 Chronic rhinitis: Secondary | ICD-10-CM | POA: Diagnosis not present

## 2022-06-06 DIAGNOSIS — J4550 Severe persistent asthma, uncomplicated: Secondary | ICD-10-CM | POA: Diagnosis not present

## 2022-07-20 DIAGNOSIS — Z713 Dietary counseling and surveillance: Secondary | ICD-10-CM | POA: Diagnosis not present

## 2022-08-07 ENCOUNTER — Telehealth: Payer: BC Managed Care – PPO | Admitting: Physician Assistant

## 2022-08-07 ENCOUNTER — Encounter: Payer: Self-pay | Admitting: Physician Assistant

## 2022-08-07 VITALS — Ht 69.0 in | Wt 149.0 lb

## 2022-08-07 DIAGNOSIS — N529 Male erectile dysfunction, unspecified: Secondary | ICD-10-CM | POA: Diagnosis not present

## 2022-08-07 DIAGNOSIS — K635 Polyp of colon: Secondary | ICD-10-CM | POA: Diagnosis not present

## 2022-08-07 MED ORDER — SILDENAFIL CITRATE 100 MG PO TABS: 50.0000 mg | ORAL_TABLET | Freq: Every day | ORAL | 5 refills | Status: DC | PRN

## 2022-08-07 NOTE — Progress Notes (Signed)
..  Virtual Visit via Video Note  I connected with Terry Phillips on 08/07/22 at 11:30 AM EDT by a video enabled telemedicine application and verified that I am speaking with the correct person using two identifiers.  Location: Patient: work Provider: clinic  .Marland KitchenParticipating in visit:  Patient: Terry Phillips Provider: Iran Planas PA-C    I discussed the limitations of evaluation and management by telemedicine and the availability of in person appointments. The patient expressed understanding and agreed to proceed.  History of Present Illness: Pt is a 56 yo male who would like to go over colonoscopy results and discuss ED.      - Two sessile polyps were found in the ascending colon and cecum. The polyps were 2 to 4 mm in size. These polyps were removed with a cold snare. Resection and retrieval were complete. Findings: - Non-bleeding internal hemorrhoids were found during retroflexion. The hemorrhoids were very small and Grade I (internal hemorrhoids that do not prolapse). - The exam was otherwise without abnormality on direct and retroflexion views.  ED struggling for a year or so. Not tried anything. Exercises regularly. No urinary or prostate symptoms. No lack of libido.   .. 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  Diagnoses and all orders for this  visit:  Erectile dysfunction, unspecified erectile dysfunction type -     sildenafil (VIAGRA) 100 MG tablet; Take 0.5-1 tablets (50-100 mg total) by mouth daily as needed for erectile dysfunction.  Hyperplastic polyp of ascending colon   Discussed colonoscopy results were benign pathology of 2 polyps with 10 year follow up.   Discussed ED and treatment. Trial of viagra as needed.  Discussed side effects. Follow up in 6 months.    Follow Up Instructions:    I discussed the assessment and treatment plan with the patient. The patient was provided an opportunity to ask questions and all were answered. The patient agreed with the plan and demonstrated an understanding of the instructions.   The patient was advised to call back or seek an in-person evaluation if the symptoms worsen or if the condition fails to improve as anticipated.     Iran Planas, PA-C

## 2022-08-07 NOTE — Progress Notes (Signed)
Pt would like to discuss ed and colonoscopy results

## 2022-08-24 DIAGNOSIS — J4550 Severe persistent asthma, uncomplicated: Secondary | ICD-10-CM | POA: Diagnosis not present

## 2022-10-05 DIAGNOSIS — Z713 Dietary counseling and surveillance: Secondary | ICD-10-CM | POA: Diagnosis not present

## 2022-10-11 DIAGNOSIS — D485 Neoplasm of uncertain behavior of skin: Secondary | ICD-10-CM | POA: Diagnosis not present

## 2022-10-11 DIAGNOSIS — L570 Actinic keratosis: Secondary | ICD-10-CM | POA: Diagnosis not present

## 2022-10-11 DIAGNOSIS — L309 Dermatitis, unspecified: Secondary | ICD-10-CM | POA: Diagnosis not present

## 2022-10-11 DIAGNOSIS — C4441 Basal cell carcinoma of skin of scalp and neck: Secondary | ICD-10-CM | POA: Diagnosis not present

## 2022-10-25 DIAGNOSIS — C4441 Basal cell carcinoma of skin of scalp and neck: Secondary | ICD-10-CM | POA: Diagnosis not present

## 2022-11-08 DIAGNOSIS — C4441 Basal cell carcinoma of skin of scalp and neck: Secondary | ICD-10-CM | POA: Diagnosis not present

## 2023-11-09 ENCOUNTER — Other Ambulatory Visit: Payer: Self-pay | Admitting: Physician Assistant

## 2023-11-09 DIAGNOSIS — N529 Male erectile dysfunction, unspecified: Secondary | ICD-10-CM

## 2023-11-28 NOTE — Telephone Encounter (Signed)
Copied from CRM [id number]. Topic: General - Other >> Nov 23, 2023  4:40 PM Alvino Blood C wrote: Reason for CRM: PT would like a call [phone]

## 2024-01-21 ENCOUNTER — Encounter: Payer: Self-pay | Admitting: Physician Assistant

## 2024-01-21 ENCOUNTER — Ambulatory Visit: Payer: BC Managed Care – PPO | Admitting: Physician Assistant

## 2024-01-21 VITALS — BP 111/72 | HR 74 | Ht 69.0 in | Wt 148.0 lb

## 2024-01-21 DIAGNOSIS — Z125 Encounter for screening for malignant neoplasm of prostate: Secondary | ICD-10-CM | POA: Diagnosis not present

## 2024-01-21 DIAGNOSIS — R195 Other fecal abnormalities: Secondary | ICD-10-CM | POA: Diagnosis not present

## 2024-01-21 NOTE — Patient Instructions (Signed)
Try metamucil or benefiber in the mornings daily to help bulk up stools.

## 2024-01-21 NOTE — Progress Notes (Unsigned)
Established Patient Office Visit  Subjective   Patient ID: Terry Phillips, male    DOB: June 10, 1966  Age: 58 y.o. MRN: [id number]  Chief Complaint  Patient presents with   Abdominal Pain    Pt states he has gas /air the the stomach that causes discomfort    HPI  Pt is a 58 yo male presenting to the clinic today complaining of loose stools and increased gas since his colonoscopy in 05/2022. He states that before his colonoscopy he would typically have a BM every day or every other day that was formed but now he is having loose stools approx 2-3x a day most days. He states that he has more loose stools than he does solid stools. He states that when he has loose stools they are brown in color but they float. He does not know if they are foul-smelling d/t his decreased sense of smell bc of nasal polyps. He denies having melena or hematochezia. He states that he eats a well-balanced diet with plenty of whole grains, fruits, and vegetables. Pt is not sure if there is anything in his diet that is causing these sxs but he denies noticing a specific trigger. He does drink about 2-3 cups of coffee daily but he has amixture of caffeinated and decaffeinated coffee. Pt also denies abd bloating, cramping, pain, early satiety, constipation, unintentional weight loss, or N/V.  Pt also states that at times he experiences fecal incontinence. He states this happens when he is performing weighted exercises and bearing down. He states that it is not a large amount of fecal matter but when he wipes there is a "white, cream-like substance".  Review of Systems  Constitutional:  Negative for weight loss.  Gastrointestinal:  Positive for diarrhea. Negative for abdominal pain, blood in stool, constipation, melena, nausea and vomiting.    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   COVID-19 virus infection 05/22/2020   Eosinophil count  raised 07/17/2017   Nasal polyposis 07/17/2017   Severe persistent asthma 04/06/2022   Trigger ring finger of right hand 04/09/2017   Anemia 04/19/2022   Hyperplastic polyp of ascending colon 08/07/2022   Erectile dysfunction 08/07/2022   Resolved Ambulatory Problems    Diagnosis Date Noted   No Resolved Ambulatory Problems   Past Medical History:  Diagnosis Date   Allergy    Asthma    Environmental allergies       Objective:     BP 111/72   Pulse 74   Ht 5\' 9"  (1.753 m)   Wt 67.1 kg   SpO2 99%   BMI 21.86 kg/m   BP Readings from Last 3 Encounters:  01/21/24 111/72  05/31/22 (!) 105/54  04/07/22 123/79   Wt Readings from Last 3 Encounters:  01/21/24 67.1 kg  08/07/22 67.6 kg  05/31/22 65.3 kg     Physical Exam Constitutional:      Appearance: Normal appearance. He is well-developed.  HENT:     Head: Normocephalic.  Cardiovascular:     Rate and Rhythm: Normal rate and regular rhythm.     Heart sounds: Normal heart sounds.  Pulmonary:     Effort: Pulmonary effort is normal.     Breath sounds: Normal breath sounds.  Abdominal:     General: Bowel sounds are normal. There is no distension.     Palpations: Abdomen is soft.     Tenderness: There is no abdominal  tenderness. There is no right CVA tenderness, left CVA tenderness or guarding.  Neurological:     General: No focal deficit present.     Mental Status: He is alert and oriented to person, place, and time.  Psychiatric:        Mood and Affect: Mood normal.     No results found for any visits on 01/21/24.  {Labs (Optional):23779}  The 10-year ASCVD risk score (Arnett DK, et al., 2019) is: 4.4%    Assessment & Plan:   Terry Phillips was seen today for abdominal pain.  Diagnoses and all orders for this visit:  Loose stools -     Celiac Disease Panel -     CBC w/Diff/Platelet -     CMP14+EGFR -     TSH -     Cdiff NAA+O+P+Stool Culture -     Sed Rate (ESR) -     PSA  Change in stool -      Celiac Disease Panel -     CBC w/Diff/Platelet -     CMP14+EGFR -     TSH -     Cdiff NAA+O+P+Stool Culture -     Sed Rate (ESR) -     PSA    CBC/CMP/TSH/ESR/PSA/Celiac panel ordered Cdiff/Stool culture ordered  Try incorporating Benefiber into diet to see if this helps to bulk stool up If symptoms do not improve refer to GI  Return if symptoms worsen or fail to improve.    Windy Fast [place], Student-PA

## 2024-01-22 ENCOUNTER — Encounter: Payer: Self-pay | Admitting: Physician Assistant

## 2024-01-22 DIAGNOSIS — R195 Other fecal abnormalities: Secondary | ICD-10-CM | POA: Insufficient documentation

## 2024-01-24 LAB — CBC WITH DIFFERENTIAL/PLATELET
Basophils Absolute: 0.1 10*3/uL (ref 0.0–0.2)
Basos: 2 %
EOS (ABSOLUTE): 0.1 10*3/uL (ref 0.0–0.4)
Eos: 2 %
Hematocrit: 37 % — ABNORMAL LOW (ref 37.5–51.0)
Hemoglobin: 12.4 g/dL — ABNORMAL LOW (ref 13.0–17.7)
Immature Grans (Abs): 0 10*3/uL (ref 0.0–0.1)
Immature Granulocytes: 1 %
Lymphocytes Absolute: 2.1 10*3/uL (ref 0.7–3.1)
Lymphs: 37 %
MCH: 30.3 pg (ref 26.6–33.0)
MCHC: 33.5 g/dL (ref 31.5–35.7)
MCV: 91 fL (ref 79–97)
Monocytes Absolute: 0.5 10*3/uL (ref 0.1–0.9)
Monocytes: 10 %
Neutrophils Absolute: 2.7 10*3/uL (ref 1.4–7.0)
Neutrophils: 48 %
Platelets: 318 10*3/uL (ref 150–450)
RBC: 4.09 x10E6/uL — ABNORMAL LOW (ref 4.14–5.80)
RDW: 12.8 % (ref 11.6–15.4)
WBC: 5.5 10*3/uL (ref 3.4–10.8)

## 2024-01-24 LAB — TSH: TSH: 2.25 u[IU]/mL (ref 0.450–4.500)

## 2024-01-24 LAB — CMP14+EGFR
ALT: 18 [IU]/L (ref 0–44)
AST: 25 [IU]/L (ref 0–40)
Albumin: 4.4 g/dL (ref 3.8–4.9)
Alkaline Phosphatase: 48 [IU]/L (ref 44–121)
BUN/Creatinine Ratio: 18 (ref 9–20)
BUN: 19 mg/dL (ref 6–24)
Bilirubin Total: 0.2 mg/dL (ref 0.0–1.2)
CO2: 23 mmol/L (ref 20–29)
Calcium: 9.7 mg/dL (ref 8.7–10.2)
Chloride: 104 mmol/L (ref 96–106)
Creatinine, Ser: 1.08 mg/dL (ref 0.76–1.27)
Globulin, Total: 2.7 g/dL (ref 1.5–4.5)
Glucose: 79 mg/dL (ref 70–99)
Potassium: 4.7 mmol/L (ref 3.5–5.2)
Sodium: 142 mmol/L (ref 134–144)
Total Protein: 7.1 g/dL (ref 6.0–8.5)
eGFR: 80 mL/min/{1.73_m2} (ref >59)

## 2024-01-24 LAB — CELIAC DISEASE PANEL: IgA/Immunoglobulin A, Serum: 307 mg/dL (ref 90–386)

## 2024-01-24 LAB — SEDIMENTATION RATE: Sed Rate: 7 mm/h (ref 0–30)

## 2024-01-24 LAB — PSA: Prostate Specific Ag, Serum: 0.5 ng/mL (ref 0.0–4.0)

## 2024-01-24 LAB — SPECIMEN STATUS REPORT

## 2024-01-25 ENCOUNTER — Encounter: Payer: Self-pay | Admitting: Physician Assistant

## 2024-06-10 ENCOUNTER — Encounter: Payer: Self-pay | Admitting: Physician Assistant

## 2024-06-10 ENCOUNTER — Ambulatory Visit: Admitting: Physician Assistant

## 2024-06-10 VITALS — BP 137/90 | HR 96 | Temp 98.0°F | Ht 69.0 in | Wt 149.0 lb

## 2024-06-10 DIAGNOSIS — L237 Allergic contact dermatitis due to plants, except food: Secondary | ICD-10-CM | POA: Insufficient documentation

## 2024-06-10 MED ORDER — CLOBETASOL PROPIONATE 0.05 % EX CREA: 1.0000 | TOPICAL_CREAM | Freq: Two times a day (BID) | CUTANEOUS | 1 refills | Status: AC

## 2024-06-10 NOTE — Patient Instructions (Signed)
Poison Ivy Dermatitis Poison ivy dermatitis is irritation and swelling (inflammation) of the skin caused by chemicals in the leaves of the poison ivy plant. The skin reaction often involves redness, blisters, and extreme itching. What are the causes? This condition is caused by a chemical (urushiol) found in the sap of the poison ivy plant. This chemical is sticky and can easily spread to people, animals, and objects. You can get poison ivy dermatitis by: Having direct contact with a poison ivy plant. Touching animals, other people, or objects that have come in contact with poison ivy and have the chemical on them. What increases the risk? This condition is more likely to develop in people who: Are outdoors often in wooded or marshy areas. Go outdoors without wearing protective clothing, such as closed shoes, long pants, and a long-sleeved shirt. What are the signs or symptoms? Symptoms of this condition include: Redness of the skin. Extreme itching. A rash that often includes bumps and blisters. The rash usually appears 48 hours after exposure, if you have been exposed before. If this is the first time you have been exposed, the rash may not appear until a week after exposure. Swelling. This may occur if the reaction is more severe. Symptoms usually last for 1-2 weeks. However, the first time you develop this condition, symptoms may last 3-4 weeks. How is this diagnosed? This condition may be diagnosed based on your symptoms and a physical exam. Your health care provider may also ask you about any recent outdoor activity. How is this treated? Treatment for this condition will vary depending on how severe it is. Treatment may include: Hydrocortisone cream or calamine lotion to relieve itching. Oatmeal baths to soothe the skin. Medicines, such as over-the-counter antihistamine tablets. Oral or injected steroid medicine, for more severe reactions. Follow these instructions at  home: Medicines Take or apply over-the-counter and prescription medicines only as told by your health care provider. Use hydrocortisone cream or calamine lotion as needed to soothe the skin and relieve itching. General instructions Do not scratch or rub your skin. Apply a cold, wet cloth (cold compress) to the affected areas or take baths in cool water. This will help with itching. Avoid hot baths and showers. Take oatmeal baths as needed. Use colloidal oatmeal. You can get this at your local pharmacy or grocery store. Follow the instructions on the packaging. Wash all clothes, bedsheets, towels, and blankets you were in contact with between your exposure and appearance of the rash. Check the affected area every day for signs of infection. Check for: More redness, swelling, or pain. Fluid or blood. Warmth. Pus or a bad smell. Keep all follow-up visits. Your health care provider may want to see how your skin is progressing with treatment. How is this prevented?  Learn to identify the poison ivy plant and avoid contact with the plant. This plant can be recognized by the number of leaves. Generally, poison ivy has three leaves with flowering branches on a single stem. The leaves are typically glossy, and they have jagged edges that come to a point. If you have been exposed to poison ivy, thoroughly wash with soap and water right away. You have about 30 minutes to remove the plant resin before it will cause the rash. Be sure to wash under your fingernails, because any plant resin there will continue to spread the rash. When hiking or camping, wear clothes that will help you to avoid skin exposure. This includes long pants, a long-sleeved shirt, long socks,   and hiking boots. You can also apply preventive lotion to your skin to help limit exposure. If you suspect that your clothes or outdoor gear came in contact with poison ivy, rinse them off outside with a garden hose before you bring them inside  your house. When doing yard work or gardening, wear gloves, long sleeves, long pants, and boots. Wash your garden tools and gloves if they come in contact with poison ivy. If you suspect that your pet has come into contact with poison ivy, wash them with pet shampoo and water. Make sure to wear gloves while washing your pet. Contact a health care provider if: You have open sores in the rash area. You have any signs of infection. You have redness that spreads beyond the rash area. You have a fever. You have a rash over a large area of your body. You have a rash on your eyes, mouth, or genitals. You have a rash that does not improve after a few weeks. Get help right away if: Your face swells or your eyes swell shut. You have trouble breathing. You have trouble swallowing. These symptoms may be an emergency. Get help right away. Call 911. Do not wait to see if the symptoms will go away. Do not drive yourself to the hospital. This information is not intended to replace advice given to you by your health care provider. Make sure you discuss any questions you have with your health care provider. Document Revised: 05/04/2022 Document Reviewed: 05/04/2022 Elsevier Patient Education  2024 [employer]  

## 2024-06-10 NOTE — Progress Notes (Signed)
 Established Patient Office Visit  Subjective   Patient ID: Terry Phillips, male    DOB: [birth year]  Age: 58 y.o. MRN: [id number]  Chief Complaint  Patient presents with   Medical Management of Chronic Issues    Itchy rash on left arm    HPI Pt presents today with a rash on his left arm. He states on Saturday he was Pressure washing his shed and there are bushes near that I'm pretty sure have Poision Ivy on them. He now has a rash on his left forearm. He has put calamine lotion on it which has not helped. He is wondering if he can have a kenalog shot to help.  Two weeks ago, he was weed eating near the same shed in the same spot and got an itchy rash on both of his lower legs. I'm pretty sure it was poison ivy, he says. He had 10 MG prednisone  tablets from his chronic lung condition so he took two a day for 5 days, then took a couple days off. Last week, he did another 5 days of prednisone  to make sure it was gone.   He is also wondering if it would have been possible for him to inhale the poison ivy from pressure washing, He does note that he was wearing a mask when he was pressure washing. He denies cough, shortness of breath or chest pain.    Review of Systems  Respiratory:  Negative for cough, hemoptysis and shortness of breath.   Cardiovascular:  Negative for chest pain.  Skin:  Positive for itching and rash.      Objective:     BP (!) 137/90   Pulse 96   Temp 98 F (36.7 C)   Ht 5' 9 (1.753 m)   Wt 149 lb (67.6 kg)   SpO2 99%   BMI 22.00 kg/m  BP Readings from Last 3 Encounters:  06/10/24 (!) 137/90  01/21/24 111/72  05/31/22 (!) 105/54   Wt Readings from Last 3 Encounters:  06/10/24 149 lb (67.6 kg)  01/21/24 148 lb (67.1 kg)  08/07/22 149 lb (67.6 kg)      Physical Exam Constitutional:      Appearance: Normal appearance.  HENT:     Head: Normocephalic.   Cardiovascular:     Rate and Rhythm: Normal rate and regular rhythm.     Pulses: Normal  pulses.     Heart sounds: Normal heart sounds.  Pulmonary:     Effort: Pulmonary effort is normal.     Breath sounds: Normal breath sounds.   Musculoskeletal:     Cervical back: Normal range of motion.   Skin:    Comments: There is a firm 2 cm by 1 cm raised plaque of erythema containing multiple papules that have crusted over. There are two 4 mm Papules on the periphery that have also scabbed over. No signs of infection and no fluctuance.    Neurological:     Mental Status: He is alert.   Psychiatric:        Mood and Affect: Mood normal.      The 10-year ASCVD risk score (Arnett DK, et al., 2019) is: 6.4%    Assessment & Plan:  Terry Phillips was seen today for medical management of chronic issues.  Diagnoses and all orders for this visit:  Poison ivy dermatitis -     clobetasol cream (TEMOVATE) 0.05 %; Apply 1 Application topically 2 (two) times daily.     Will treat with  topical clobetasol 2x a day and cold compresses. Educated patient on prevention and too wash thoroughly after working outside and avoid areas with known ivy or wear protective clothing if he must work near it.  Also educated patient that use of another steroid would not be wise as there are many potential side effects with long term use. Educated him that this includes Immunosuppression and increased infections which would be further complicated by his lung condition. Educated patient that it is possible to inhale residue from poison Porter but this is very rare and usually only happens it were to be burnt and the smoke was inhaled. Also let him know that this is highly unlikely due to him wearing a mask while pressure washing and the fact that he has no pulmonary symptoms at the moment.

## 2024-06-11 ENCOUNTER — Encounter: Payer: Self-pay | Admitting: Physician Assistant

## 2024-07-15 ENCOUNTER — Ambulatory Visit: Admitting: Sports Medicine

## 2024-07-15 ENCOUNTER — Ambulatory Visit

## 2024-07-15 ENCOUNTER — Ambulatory Visit: Payer: Self-pay

## 2024-07-15 ENCOUNTER — Encounter: Payer: Self-pay | Admitting: Sports Medicine

## 2024-07-15 DIAGNOSIS — J329 Chronic sinusitis, unspecified: Secondary | ICD-10-CM

## 2024-07-15 DIAGNOSIS — J40 Bronchitis, not specified as acute or chronic: Secondary | ICD-10-CM

## 2024-07-15 DIAGNOSIS — R051 Acute cough: Secondary | ICD-10-CM

## 2024-07-15 DIAGNOSIS — R0789 Other chest pain: Secondary | ICD-10-CM | POA: Diagnosis not present

## 2024-07-15 DIAGNOSIS — R059 Cough, unspecified: Secondary | ICD-10-CM | POA: Diagnosis not present

## 2024-07-15 LAB — POC SOFIA 2 FLU + SARS ANTIGEN FIA
Influenza A, POC: NEGATIVE
Influenza B, POC: NEGATIVE
SARS Coronavirus 2 Ag: NEGATIVE

## 2024-07-15 MED ORDER — PREDNISONE 50 MG PO TABS
ORAL_TABLET | ORAL | 0 refills | Status: DC
Start: 2024-07-15 — End: 2024-10-13

## 2024-07-15 MED ORDER — DOXYCYCLINE HYCLATE 100 MG PO TABS: 100.0000 mg | ORAL_TABLET | Freq: Two times a day (BID) | ORAL | 0 refills | Status: AC

## 2024-07-15 NOTE — Telephone Encounter (Signed)
 FYI Only or Action Required?: Action required by provider: request for appointment.  Patient was last seen in primary care on 06/10/2024 by Antoniette Vermell CROME, PA-C.  Called Nurse Triage reporting Cough.  Symptoms began several days ago.  Interventions attempted: OTC medications:  SABRA  Symptoms are: gradually worsening.Non-productive cough, sinus pain pressure, yellow mucus. History of asthma. Warm transfer to Luke in the practice for appointment.  Triage Disposition: See Physician Within 24 Hours  Patient/caregiver understands and will follow disposition?: Yes    Copied from CRM [id number]. Topic: Clinical - Red Word Triage >> Jul 15, 2024  7:54 AM Suzette B wrote: Sinus pressure, chest tightening, coughing. Answer Assessment - Initial Assessment Questions 1. ONSET: When did the cough begin?      Saturday 2. SEVERITY: How bad is the cough today?      moderate 3. SPUTUM: Describe the color of your sputum (e.g., none, dry cough; clear, white, yellow, green)     thick 4. HEMOPTYSIS: Are you coughing up any blood? If Yes, ask: How much? (e.g., flecks, streaks, tablespoons, etc.)     no 5. DIFFICULTY BREATHING: Are you having difficulty breathing? If Yes, ask: How bad is it? (e.g., mild, moderate, severe)      SOB, chest tight 6. FEVER: Do you have a fever? If Yes, ask: What is your temperature, how was it measured, and when did it start?     no 7. CARDIAC HISTORY: Do you have any history of heart disease? (e.g., heart attack, congestive heart failure)      no 8. LUNG HISTORY: Do you have any history of lung disease?  (e.g., pulmonary embolus, asthma, emphysema)     asthma 9. PE RISK FACTORS: Do you have a history of blood clots? (or: recent major surgery, recent prolonged travel, bedridden)     no 10. OTHER SYMPTOMS: Do you have any other symptoms? (e.g., runny nose, wheezing, chest pain)       Sore throat 11. PREGNANCY: Is there any chance you are pregnant?  When was your last menstrual period?       N/a 12. TRAVEL: Have you traveled out of the country in the last month? (e.g., travel history, exposures)       no  Protocols used: Cough - Acute Non-Productive-A-AH  Reason for Disposition  [1] Continuous (nonstop) coughing interferes with work or school AND [2] no improvement using cough treatment per Care Advice  Answer Assessment - Initial Assessment Questions 1. ONSET: When did the cough begin?      Saturday 2. SEVERITY: How bad is the cough today?      moderate 3. SPUTUM: Describe the color of your sputum (e.g., none, dry cough; clear, white, yellow, green)     thick 4. HEMOPTYSIS: Are you coughing up any blood? If Yes, ask: How much? (e.g., flecks, streaks, tablespoons, etc.)     no 5. DIFFICULTY BREATHING: Are you having difficulty breathing? If Yes, ask: How bad is it? (e.g., mild, moderate, severe)      SOB, chest tight 6. FEVER: Do you have a fever? If Yes, ask: What is your temperature, how was it measured, and when did it start?     no 7. CARDIAC HISTORY: Do you have any history of heart disease? (e.g., heart attack, congestive heart failure)      no 8. LUNG HISTORY: Do you have any history of lung disease?  (e.g., pulmonary embolus, asthma, emphysema)     asthma 9. PE RISK FACTORS: Do you  have a history of blood clots? (or: recent major surgery, recent prolonged travel, bedridden)     no 10. OTHER SYMPTOMS: Do you have any other symptoms? (e.g., runny nose, wheezing, chest pain)       Sore throat 11. PREGNANCY: Is there any chance you are pregnant? When was your last menstrual period?       N/a 12. TRAVEL: Have you traveled out of the country in the last month? (e.g., travel history, exposures)       no  Protocols used: Cough - Acute Non-Productive-A-AH

## 2024-07-15 NOTE — Progress Notes (Signed)
    Procedures performed today:    None.  Independent interpretation of notes and tests performed by another provider:   None.  Brief History, Exam, Impression, and Recommendations:    Sinobronchitis Very pleasant 58 year old male, history of asthma, he has had about 2 to 3 days of fatigue, facial pain and pressure, sore throat, nonproductive cough. No GI symptoms, fevers, chills, muscle aches, body aches. Head and neck exam is normal, he does have prolonged expiratory phase without wheezing or coarse sounds. Considering history of asthma we will treat him aggressively for what I suspect to be a sinobronchitis, adding doxycycline , prednisone , he is macrolide allergic. Return to see us  if not better in a few weeks.  I spent 30 minutes of total time managing this patient today, this includes chart review, face to face, and non-face to face time.  ____________________________________________ Debby PARAS. Curtis, M.D., ABFM., CAQSM., AME. Primary Care and Sports Medicine [facility]  Adjunct Professor of [facility]ician [facility] Medicine  [employer]  Restaurant manager, fast food

## 2024-07-15 NOTE — Assessment & Plan Note (Signed)
 Very pleasant 59 year old male, history of asthma, he has had about 2 to 3 days of fatigue, facial pain and pressure, sore throat, nonproductive cough. No GI symptoms, fevers, chills, muscle aches, body aches. Head and neck exam is normal, he does have prolonged expiratory phase without wheezing or coarse sounds. Considering history of asthma we will treat him aggressively for what I suspect to be a sinobronchitis, adding doxycycline , prednisone , he is macrolide allergic. Return to see us  if not better in a few weeks.

## 2024-08-19 ENCOUNTER — Encounter: Payer: Self-pay | Admitting: Sports Medicine

## 2024-08-27 DIAGNOSIS — Z713 Dietary counseling and surveillance: Secondary | ICD-10-CM | POA: Diagnosis not present

## 2024-09-03 DIAGNOSIS — Z713 Dietary counseling and surveillance: Secondary | ICD-10-CM | POA: Diagnosis not present

## 2024-09-10 DIAGNOSIS — Z713 Dietary counseling and surveillance: Secondary | ICD-10-CM | POA: Diagnosis not present

## 2024-09-17 DIAGNOSIS — Z713 Dietary counseling and surveillance: Secondary | ICD-10-CM | POA: Diagnosis not present

## 2024-10-13 ENCOUNTER — Ambulatory Visit: Admitting: Family Medicine

## 2024-10-13 ENCOUNTER — Encounter: Payer: Self-pay | Admitting: Family Medicine

## 2024-10-13 VITALS — BP 107/64 | HR 77 | Temp 99.1°F | Ht 69.0 in | Wt 147.7 lb

## 2024-10-13 DIAGNOSIS — H109 Unspecified conjunctivitis: Secondary | ICD-10-CM | POA: Insufficient documentation

## 2024-10-13 DIAGNOSIS — R0981 Nasal congestion: Secondary | ICD-10-CM | POA: Insufficient documentation

## 2024-10-13 DIAGNOSIS — H66002 Acute suppurative otitis media without spontaneous rupture of ear drum, left ear: Secondary | ICD-10-CM | POA: Insufficient documentation

## 2024-10-13 DIAGNOSIS — J0100 Acute maxillary sinusitis, unspecified: Secondary | ICD-10-CM | POA: Diagnosis not present

## 2024-10-13 LAB — POC COVID19 BINAXNOW: SARS Coronavirus 2 Ag: NEGATIVE

## 2024-10-13 MED ORDER — DOXYCYCLINE HYCLATE 100 MG PO TABS: 100.0000 mg | ORAL_TABLET | Freq: Two times a day (BID) | ORAL | 0 refills | Status: AC

## 2024-10-13 MED ORDER — PREDNISONE 50 MG PO TABS: ORAL_TABLET | ORAL | 0 refills | Status: AC

## 2024-10-13 MED ORDER — POLYMYXIN B-TRIMETHOPRIM 10000-0.1 UNIT/ML-% OP SOLN: 1.0000 [drp] | Freq: Four times a day (QID) | OPHTHALMIC | 0 refills | Status: AC

## 2024-10-13 NOTE — Progress Notes (Signed)
 Acute Office Visit  Subjective:     Patient ID: Terry Phillips, male    DOB: 02-Jan-1966, 58 y.o.   MRN: [id number]  Chief Complaint  Patient presents with   Covid Exposure    Stuffy nose, gritty eyes, chest congestion, ears are clogged. Started last Thursday. Have been out of town ([place]) until Saturday. Have had a slight fever.    HPI Patient is in today for URI symptoms that started last Wednesday evening.  Reports feeling a little run down after traveling. Weather in [place] was cold and rainy. Congestion and feeling like ears are clogged. Left eye exudate, eye stuck today on Friday, now moved to left eye.   ROS      Objective:    BP 107/64 (BP Location: Left Arm, Patient Position: Sitting, Cuff Size: Normal)   Pulse 77   Temp 99.1 F (37.3 C) (Oral)   Ht 5' 9 (1.753 m)   Wt 147 lb 11.2 oz (67 kg)   BMI 21.81 kg/m    Physical Exam Vitals and nursing note reviewed.  Constitutional:      General: He is not in acute distress.    Appearance: Normal appearance. He is ill-appearing.  HENT:     Right Ear: Tympanic membrane normal.     Left Ear: Tympanic membrane is erythematous.     Nose:     Right Sinus: No maxillary sinus tenderness or frontal sinus tenderness.     Left Sinus: Maxillary sinus tenderness and frontal sinus tenderness present.     Mouth/Throat:     Pharynx: Uvula midline. No oropharyngeal exudate or posterior oropharyngeal erythema.  Cardiovascular:     Rate and Rhythm: Normal rate and regular rhythm.     Heart sounds: Normal heart sounds.  Pulmonary:     Effort: Pulmonary effort is normal.     Breath sounds: Normal breath sounds.  Skin:    General: Skin is warm and dry.  Neurological:     General: No focal deficit present.     Mental Status: He is alert. Mental status is at baseline.  Psychiatric:        Mood and Affect: Mood normal.        Behavior: Behavior normal.        Thought Content: Thought content normal.        Judgment:  Judgment normal.     Results for orders placed or performed in visit on 10/13/24  POC COVID-19 BinaxNow  Result Value Ref Range   SARS Coronavirus 2 Ag Negative Negative        Assessment & Plan:   Problem List Items Addressed This Visit     Conjunctivitis, bacterial - Primary   Symptoms started in left eye with being welded together in the morning. He had to use warm water to get eye open. Symptoms have spread to right eye today. Conjunctiva is erythematous with reports of exudate. Polytrim ophthalmic solution one drop in each eye BID for next 5-7 days. Continue with  warm compresses for comfort.        Relevant Medications   trimethoprim-polymyxin b (POLYTRIM) ophthalmic solution   Acute non-recurrent maxillary sinusitis   Reports URI symptoms present since last Wednesday.  Rapid Covid negative in office today.  There is nasal congestion and pain with palpation in maxillary and frontal sinuses. Symptoms are consistent with acute sinusitis. Doxycycline  100 mg BID for 10 days. Drink full glass of water and avoid lying down for 30 minutes after  taking doxycycline .  Sinus rinses. Prednisone  50 mg daily for 5 days.  Follow-up if symptoms do not resolve.        Relevant Medications   doxycycline  (VIBRA -TABS) 100 MG tablet   predniSONE  (DELTASONE ) 50 MG tablet   Head congestion   Relevant Orders   POC COVID-19 BinaxNow (Completed)    Meds ordered this encounter  Medications   trimethoprim-polymyxin b (POLYTRIM) ophthalmic solution    Sig: Place 1 drop into both eyes every 6 (six) hours.    Dispense:  10 mL    Refill:  0    Supervising Provider:   METHENEY, CATHERINE D [[id number]]   doxycycline  (VIBRA -TABS) 100 MG tablet    Sig: Take 1 tablet (100 mg total) by mouth 2 (two) times daily.    Dispense:  20 tablet    Refill:  0    Supervising Provider:   METHENEY, CATHERINE D [[id number]]   predniSONE  (DELTASONE ) 50 MG tablet    Sig: Take one tablelt by mouth daily for next  5 days    Dispense:  5 tablet    Refill:  0    Supervising Provider:   METHENEY, CATHERINE D [[id number]]  Agrees with plan of care discussed.  Questions answered. +  Return if symptoms worsen or fail to improve.  Darice JONELLE Brownie, FNP

## 2024-10-13 NOTE — Assessment & Plan Note (Signed)
 Symptoms started in left eye with being welded together in the morning. He had to use warm water to get eye open. Symptoms have spread to right eye today. Conjunctiva is erythematous with reports of exudate. Polytrim ophthalmic solution one drop in each eye BID for next 5-7 days. Continue with  warm compresses for comfort.

## 2024-10-13 NOTE — Assessment & Plan Note (Addendum)
 Reports URI symptoms present since last Wednesday.  Rapid Covid negative in office today.  There is nasal congestion and pain with palpation in maxillary and frontal sinuses. Symptoms are consistent with acute sinusitis. Doxycycline  100 mg BID for 10 days. Drink full glass of water and avoid lying down for 30 minutes after taking doxycycline .  Sinus rinses. Prednisone  50 mg daily for 5 days.  Follow-up if symptoms do not resolve.

## 2024-12-15 DIAGNOSIS — J101 Influenza due to other identified influenza virus with other respiratory manifestations: Secondary | ICD-10-CM | POA: Diagnosis not present

## 2024-12-15 DIAGNOSIS — R051 Acute cough: Secondary | ICD-10-CM | POA: Diagnosis not present
